# Patient Record
Sex: Male | Born: 1950 | Race: White | Hispanic: No | Smoking: Never smoker
Health system: Southern US, Community
[De-identification: ages and names within clinical notes are randomized; demographics above are authoritative.]

## PROBLEM LIST (undated history)

## (undated) DIAGNOSIS — I10 Essential (primary) hypertension: Secondary | ICD-10-CM

## (undated) DIAGNOSIS — E7800 Pure hypercholesterolemia, unspecified: Secondary | ICD-10-CM

## (undated) HISTORY — PX: HERNIA REPAIR: SHX51

## (undated) HISTORY — DX: Pure hypercholesterolemia, unspecified: E78.00

## (undated) HISTORY — DX: Essential (primary) hypertension: I10

## (undated) HISTORY — PX: WRIST FRACTURE SURGERY: SHX121

---

## 1979-03-03 HISTORY — PX: WRIST SURGERY: SHX841

## 2004-07-21 ENCOUNTER — Ambulatory Visit (HOSPITAL_COMMUNITY): Admission: RE | Admit: 2004-07-21 | Discharge: 2004-07-21 | Payer: Self-pay | Admitting: Family Medicine

## 2005-03-20 ENCOUNTER — Ambulatory Visit (HOSPITAL_COMMUNITY): Admission: RE | Admit: 2005-03-20 | Discharge: 2005-03-20 | Payer: Self-pay | Admitting: Family Medicine

## 2005-03-21 ENCOUNTER — Ambulatory Visit (HOSPITAL_COMMUNITY): Admission: RE | Admit: 2005-03-21 | Discharge: 2005-03-21 | Payer: Self-pay | Admitting: Family Medicine

## 2008-04-28 ENCOUNTER — Ambulatory Visit (HOSPITAL_COMMUNITY): Admission: RE | Admit: 2008-04-28 | Discharge: 2008-04-28 | Payer: Self-pay | Admitting: Internal Medicine

## 2008-04-28 ENCOUNTER — Ambulatory Visit: Payer: Self-pay | Admitting: Internal Medicine

## 2009-08-24 ENCOUNTER — Ambulatory Visit (HOSPITAL_COMMUNITY): Admission: RE | Admit: 2009-08-24 | Discharge: 2009-08-24 | Payer: Self-pay | Admitting: Family Medicine

## 2009-09-21 ENCOUNTER — Encounter: Payer: Self-pay | Admitting: Orthopedic Surgery

## 2009-11-07 ENCOUNTER — Encounter: Payer: Self-pay | Admitting: Orthopedic Surgery

## 2009-11-08 ENCOUNTER — Ambulatory Visit: Payer: Self-pay | Admitting: Orthopedic Surgery

## 2009-11-08 DIAGNOSIS — G5600 Carpal tunnel syndrome, unspecified upper limb: Secondary | ICD-10-CM

## 2009-11-08 DIAGNOSIS — M19039 Primary osteoarthritis, unspecified wrist: Secondary | ICD-10-CM | POA: Insufficient documentation

## 2009-11-08 DIAGNOSIS — S62009A Unspecified fracture of navicular [scaphoid] bone of unspecified wrist, initial encounter for closed fracture: Secondary | ICD-10-CM
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
## 2010-08-01 NOTE — Medication Information (Signed)
Summary: RX Folder  RX Folder   Imported By: Cammie Sickle 11/09/2009 11:31:09  _____________________________________________________________________  External Attachment:    Type:   Image     Comment:   External Document

## 2010-08-01 NOTE — Letter (Signed)
Summary: History form  History form   Imported By: Jacklynn Ganong 11/10/2009 08:07:03  _____________________________________________________________________  External Attachment:    Type:   Image     Comment:   External Document

## 2010-11-14 NOTE — Op Note (Signed)
NAMEMONNIE, ANSPACH          ACCOUNT NO.:  [id number]   MEDICAL RECORD NO.:  [id number]          PATIENT TYPE:  AMB   LOCATION:  DAY                           FACILITY:  [facility]   PHYSICIAN:  R. Roetta Sessions, M.D. DATE OF BIRTH:  10-04-1950   DATE OF PROCEDURE:  04/28/2008  DATE OF DISCHARGE:                               OPERATIVE REPORT   INDICATIONS FOR PROCEDURE:  A 60 year old gentleman with intermittent  rectal bleeding when he wipes, referred at the courtesy of Dr. Janna Arch  for colorectal cancer screening.  He has never had his lower GI tract  evaluated.  He has no family history of polyps or colon cancer.  He is  here for colonoscopy.  Risks, benefits, alternatives, and limitations  have been reviewed, questions answered, and he is agreeable.  Please see  the documentation in the medical record.   PROCEDURE NOTE:  O2 saturation, blood pressure, pulse, and respirations  were monitored throughout the entire procedure.   CONSCIOUS SEDATION:  Versed 4 mg IV, Demerol 100 mg IV in divided doses.   INSTRUMENT:  Pentax video chip system.   FINDINGS:  Digital rectal examination revealed no abnormalities.  Endoscopic Findings:  Prep was adequate.  Colon:  Colonic mucosa was  surveyed from the rectosigmoid junction through the left transverse,  right colon, the appendiceal orifice, ileocecal valve, and cecum.  These  structures were well seen and photographed for the record.  From this  level, scope was withdrawn.  All previous mentioned mucosal surfaces  were again seen.  The colonic mucosa appeared entirely normal.  Scope  was pulled down the rectum.  Thorough examination of the rectal mucosa  including retroflexed view of the anal verge demonstrated internal  hemorrhoids, anal papilla only.  The patient tolerated the procedure  well and was reactive in endoscopy.   IMPRESSION:  Internal hemorrhoids, anal papilla, otherwise normal rectum  and colon.   RECOMMENDATIONS:   Hemorrhoid literature provided to Mr. Whiteside.  He is  a long distance Naval architect.  Consider donut __________ cushion to his  seat.  Anusol-HC Suppositories one per rectum at bedtime, daily fiber  supplement, Metamucil or Citrucel.  Repeat screening colonoscopy in 10  years.      Jonathon Bellows, M.D.  Electronically Signed     RMR/MEDQ  D:  04/28/2008  T:  04/28/2008  Job:  [id number]   cc:   Melvyn Novas, MD  Fax: [phone]

## 2011-01-30 ENCOUNTER — Ambulatory Visit (INDEPENDENT_AMBULATORY_CARE_PROVIDER_SITE_OTHER): Payer: BC Managed Care – PPO | Admitting: Orthopedic Surgery

## 2011-01-30 ENCOUNTER — Encounter: Payer: Self-pay | Admitting: Orthopedic Surgery

## 2011-01-30 VITALS — Resp 16 | Ht 68.0 in | Wt 194.0 lb

## 2011-01-30 DIAGNOSIS — M19039 Primary osteoarthritis, unspecified wrist: Secondary | ICD-10-CM

## 2011-01-30 NOTE — Patient Instructions (Addendum)
Referral to Dr. Amanda Pea for wrist
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
## 2011-01-31 ENCOUNTER — Encounter: Payer: Self-pay | Admitting: Orthopedic Surgery

## 2011-01-31 ENCOUNTER — Telehealth: Payer: Self-pay | Admitting: Radiology

## 2011-01-31 DIAGNOSIS — M19039 Primary osteoarthritis, unspecified wrist: Secondary | ICD-10-CM | POA: Insufficient documentation

## 2011-01-31 NOTE — Telephone Encounter (Signed)
I faxed a referral for this patient to [facility] to see Dr. Amanda Pea for arthritis of his wrist.

## 2016-01-23 ENCOUNTER — Other Ambulatory Visit (HOSPITAL_COMMUNITY): Payer: Self-pay | Admitting: Family Medicine

## 2016-01-23 ENCOUNTER — Ambulatory Visit (HOSPITAL_COMMUNITY)
Admission: RE | Admit: 2016-01-23 | Discharge: 2016-01-23 | Disposition: A | Discharge status: Home / Self Care | Payer: BLUE CROSS/BLUE SHIELD | Source: Ambulatory Visit | Attending: Family Medicine | Admitting: Family Medicine

## 2016-01-23 DIAGNOSIS — R918 Other nonspecific abnormal finding of lung field: Secondary | ICD-10-CM | POA: Insufficient documentation

## 2016-01-23 DIAGNOSIS — R062 Wheezing: Secondary | ICD-10-CM | POA: Diagnosis present

## 2016-08-28 DIAGNOSIS — I110 Hypertensive heart disease with heart failure: Secondary | ICD-10-CM | POA: Diagnosis not present

## 2016-08-28 DIAGNOSIS — R5383 Other fatigue: Secondary | ICD-10-CM | POA: Diagnosis not present

## 2016-12-24 ENCOUNTER — Other Ambulatory Visit (HOSPITAL_COMMUNITY): Payer: Self-pay | Admitting: Family Medicine

## 2016-12-24 ENCOUNTER — Ambulatory Visit (HOSPITAL_COMMUNITY)
Admission: RE | Admit: 2016-12-24 | Discharge: 2016-12-24 | Disposition: A | Discharge status: Home / Self Care | Payer: Medicare Other | Source: Ambulatory Visit | Attending: Family Medicine | Admitting: Family Medicine

## 2016-12-24 DIAGNOSIS — R053 Chronic cough: Secondary | ICD-10-CM

## 2016-12-24 DIAGNOSIS — R05 Cough: Secondary | ICD-10-CM

## 2016-12-24 DIAGNOSIS — R918 Other nonspecific abnormal finding of lung field: Secondary | ICD-10-CM | POA: Diagnosis not present

## 2016-12-24 DIAGNOSIS — M2550 Pain in unspecified joint: Secondary | ICD-10-CM | POA: Diagnosis not present

## 2016-12-24 DIAGNOSIS — E784 Other hyperlipidemia: Secondary | ICD-10-CM | POA: Diagnosis not present

## 2016-12-24 DIAGNOSIS — H04123 Dry eye syndrome of bilateral lacrimal glands: Secondary | ICD-10-CM | POA: Diagnosis not present

## 2016-12-24 DIAGNOSIS — K219 Gastro-esophageal reflux disease without esophagitis: Secondary | ICD-10-CM | POA: Diagnosis not present

## 2016-12-24 DIAGNOSIS — J45909 Unspecified asthma, uncomplicated: Secondary | ICD-10-CM | POA: Diagnosis not present

## 2016-12-25 ENCOUNTER — Other Ambulatory Visit (HOSPITAL_COMMUNITY): Payer: Self-pay | Admitting: Family Medicine

## 2016-12-25 DIAGNOSIS — I110 Hypertensive heart disease with heart failure: Secondary | ICD-10-CM | POA: Diagnosis not present

## 2016-12-25 DIAGNOSIS — R918 Other nonspecific abnormal finding of lung field: Secondary | ICD-10-CM

## 2016-12-25 DIAGNOSIS — E784 Other hyperlipidemia: Secondary | ICD-10-CM | POA: Diagnosis not present

## 2016-12-25 DIAGNOSIS — R5383 Other fatigue: Secondary | ICD-10-CM | POA: Diagnosis not present

## 2016-12-25 DIAGNOSIS — E559 Vitamin D deficiency, unspecified: Secondary | ICD-10-CM | POA: Diagnosis not present

## 2016-12-25 DIAGNOSIS — D510 Vitamin B12 deficiency anemia due to intrinsic factor deficiency: Secondary | ICD-10-CM | POA: Diagnosis not present

## 2016-12-25 DIAGNOSIS — Z125 Encounter for screening for malignant neoplasm of prostate: Secondary | ICD-10-CM | POA: Diagnosis not present

## 2016-12-27 ENCOUNTER — Ambulatory Visit (HOSPITAL_COMMUNITY)
Admission: RE | Admit: 2016-12-27 | Discharge: 2016-12-27 | Disposition: A | Discharge status: Home / Self Care | Payer: Medicare Other | Source: Ambulatory Visit | Attending: Family Medicine | Admitting: Family Medicine

## 2016-12-27 ENCOUNTER — Encounter (HOSPITAL_COMMUNITY): Payer: Self-pay

## 2016-12-27 DIAGNOSIS — I700 Atherosclerosis of aorta: Secondary | ICD-10-CM | POA: Diagnosis not present

## 2016-12-27 DIAGNOSIS — R918 Other nonspecific abnormal finding of lung field: Secondary | ICD-10-CM | POA: Diagnosis not present

## 2016-12-27 DIAGNOSIS — I2510 Atherosclerotic heart disease of native coronary artery without angina pectoris: Secondary | ICD-10-CM | POA: Insufficient documentation

## 2016-12-27 DIAGNOSIS — R911 Solitary pulmonary nodule: Secondary | ICD-10-CM | POA: Insufficient documentation

## 2016-12-27 LAB — POCT I-STAT CREATININE: CREATININE: 1.2 mg/dL (ref 0.61–1.24)

## 2016-12-27 MED ORDER — IOPAMIDOL (ISOVUE-300) INJECTION 61%
100.0000 mL | Freq: Once | INTRAVENOUS | Status: DC | PRN
Start: 2016-12-27 — End: 2016-12-27

## 2016-12-27 MED ORDER — IOPAMIDOL (ISOVUE-300) INJECTION 61%
75.0000 mL | Freq: Once | INTRAVENOUS | Status: AC | PRN
  Administered 2016-12-27: 75 mL via INTRAVENOUS

## 2017-01-09 ENCOUNTER — Other Ambulatory Visit (HOSPITAL_COMMUNITY): Payer: Self-pay | Admitting: Family Medicine

## 2017-01-09 DIAGNOSIS — R911 Solitary pulmonary nodule: Secondary | ICD-10-CM

## 2017-01-09 DIAGNOSIS — E784 Other hyperlipidemia: Secondary | ICD-10-CM | POA: Diagnosis not present

## 2017-01-09 DIAGNOSIS — J45909 Unspecified asthma, uncomplicated: Secondary | ICD-10-CM | POA: Diagnosis not present

## 2017-01-09 DIAGNOSIS — K219 Gastro-esophageal reflux disease without esophagitis: Secondary | ICD-10-CM | POA: Diagnosis not present

## 2017-01-09 DIAGNOSIS — R05 Cough: Secondary | ICD-10-CM | POA: Diagnosis not present

## 2017-01-22 ENCOUNTER — Ambulatory Visit (HOSPITAL_COMMUNITY)
Admission: RE | Admit: 2017-01-22 | Discharge: 2017-01-22 | Disposition: A | Discharge status: Home / Self Care | Payer: Medicare Other | Source: Ambulatory Visit | Attending: Family Medicine | Admitting: Family Medicine

## 2017-01-22 DIAGNOSIS — R911 Solitary pulmonary nodule: Secondary | ICD-10-CM | POA: Insufficient documentation

## 2017-01-22 DIAGNOSIS — I714 Abdominal aortic aneurysm, without rupture: Secondary | ICD-10-CM | POA: Diagnosis not present

## 2017-01-22 DIAGNOSIS — I723 Aneurysm of iliac artery: Secondary | ICD-10-CM | POA: Insufficient documentation

## 2017-01-22 LAB — GLUCOSE, CAPILLARY: Glucose-Capillary: 120 mg/dL — ABNORMAL HIGH (ref 65–99)

## 2017-01-22 MED ORDER — FLUDEOXYGLUCOSE F - 18 (FDG) INJECTION
11.5000 | Freq: Once | INTRAVENOUS | Status: AC | PRN
  Administered 2017-01-22: 11.5 via INTRAVENOUS

## 2017-01-24 DIAGNOSIS — J45909 Unspecified asthma, uncomplicated: Secondary | ICD-10-CM | POA: Diagnosis not present

## 2017-01-24 DIAGNOSIS — E784 Other hyperlipidemia: Secondary | ICD-10-CM | POA: Diagnosis not present

## 2017-01-24 DIAGNOSIS — I110 Hypertensive heart disease with heart failure: Secondary | ICD-10-CM | POA: Diagnosis not present

## 2017-01-24 DIAGNOSIS — R05 Cough: Secondary | ICD-10-CM | POA: Diagnosis not present

## 2017-04-05 DIAGNOSIS — R911 Solitary pulmonary nodule: Secondary | ICD-10-CM | POA: Diagnosis not present

## 2017-04-05 DIAGNOSIS — R0982 Postnasal drip: Secondary | ICD-10-CM | POA: Diagnosis not present

## 2017-04-05 DIAGNOSIS — E782 Mixed hyperlipidemia: Secondary | ICD-10-CM | POA: Diagnosis not present

## 2017-04-05 DIAGNOSIS — R05 Cough: Secondary | ICD-10-CM | POA: Diagnosis not present

## 2017-04-05 DIAGNOSIS — Z6830 Body mass index (BMI) 30.0-30.9, adult: Secondary | ICD-10-CM | POA: Diagnosis not present

## 2017-04-05 DIAGNOSIS — I714 Abdominal aortic aneurysm, without rupture: Secondary | ICD-10-CM | POA: Diagnosis not present

## 2017-04-05 DIAGNOSIS — I10 Essential (primary) hypertension: Secondary | ICD-10-CM | POA: Diagnosis not present

## 2017-07-17 DIAGNOSIS — I723 Aneurysm of iliac artery: Secondary | ICD-10-CM | POA: Diagnosis not present

## 2017-07-17 DIAGNOSIS — I714 Abdominal aortic aneurysm, without rupture: Secondary | ICD-10-CM | POA: Diagnosis not present

## 2017-07-25 DIAGNOSIS — Z575 Occupational exposure to toxic agents in other industries: Secondary | ICD-10-CM | POA: Diagnosis not present

## 2017-07-25 DIAGNOSIS — Z01810 Encounter for preprocedural cardiovascular examination: Secondary | ICD-10-CM | POA: Diagnosis not present

## 2017-07-25 DIAGNOSIS — R05 Cough: Secondary | ICD-10-CM | POA: Diagnosis not present

## 2017-07-25 DIAGNOSIS — Z5739 Occupational exposure to other air contaminants: Secondary | ICD-10-CM | POA: Diagnosis not present

## 2017-07-25 DIAGNOSIS — I714 Abdominal aortic aneurysm, without rupture: Secondary | ICD-10-CM | POA: Diagnosis not present

## 2017-07-25 DIAGNOSIS — R911 Solitary pulmonary nodule: Secondary | ICD-10-CM | POA: Diagnosis not present

## 2017-08-05 DIAGNOSIS — R911 Solitary pulmonary nodule: Secondary | ICD-10-CM | POA: Diagnosis not present

## 2017-08-06 DIAGNOSIS — I10 Essential (primary) hypertension: Secondary | ICD-10-CM | POA: Diagnosis present

## 2017-08-06 DIAGNOSIS — R911 Solitary pulmonary nodule: Secondary | ICD-10-CM | POA: Diagnosis present

## 2017-08-06 DIAGNOSIS — I959 Hypotension, unspecified: Secondary | ICD-10-CM | POA: Diagnosis not present

## 2017-08-06 DIAGNOSIS — R918 Other nonspecific abnormal finding of lung field: Secondary | ICD-10-CM | POA: Diagnosis not present

## 2017-08-06 DIAGNOSIS — I723 Aneurysm of iliac artery: Secondary | ICD-10-CM | POA: Diagnosis present

## 2017-08-06 DIAGNOSIS — J952 Acute pulmonary insufficiency following nonthoracic surgery: Secondary | ICD-10-CM | POA: Diagnosis not present

## 2017-08-06 DIAGNOSIS — E876 Hypokalemia: Secondary | ICD-10-CM | POA: Diagnosis not present

## 2017-08-06 DIAGNOSIS — D62 Acute posthemorrhagic anemia: Secondary | ICD-10-CM | POA: Diagnosis not present

## 2017-08-06 DIAGNOSIS — K429 Umbilical hernia without obstruction or gangrene: Secondary | ICD-10-CM | POA: Diagnosis present

## 2017-08-06 DIAGNOSIS — I714 Abdominal aortic aneurysm, without rupture: Secondary | ICD-10-CM | POA: Diagnosis present

## 2017-08-06 DIAGNOSIS — Z9981 Dependence on supplemental oxygen: Secondary | ICD-10-CM | POA: Diagnosis not present

## 2017-08-06 DIAGNOSIS — E785 Hyperlipidemia, unspecified: Secondary | ICD-10-CM | POA: Diagnosis present

## 2017-08-11 MED ORDER — HYDROCODONE-ACETAMINOPHEN 5-325 MG PO TABS
1.00 | ORAL_TABLET | ORAL | Status: DC
Start: ? — End: 2017-08-11

## 2017-08-11 MED ORDER — POLYETHYL GLYCOL-PROPYL GLYCOL 0.4-0.3 % OP SOLN
1.00 | OPHTHALMIC | Status: DC
Start: ? — End: 2017-08-11

## 2017-08-11 MED ORDER — SIMETHICONE 80 MG PO CHEW
80.00 | CHEWABLE_TABLET | ORAL | Status: DC
Start: ? — End: 2017-08-11

## 2017-08-11 MED ORDER — GENERIC EXTERNAL MEDICATION
1.00 | Status: DC
Start: ? — End: 2017-08-11

## 2017-08-11 MED ORDER — PANTOPRAZOLE SODIUM 40 MG PO TBEC
40.00 | DELAYED_RELEASE_TABLET | ORAL | Status: DC
Start: 2017-08-12 — End: 2017-08-11

## 2017-08-11 MED ORDER — GENERIC EXTERNAL MEDICATION
2.00 | Status: DC
Start: ? — End: 2017-08-11

## 2017-08-11 MED ORDER — ACETAMINOPHEN 325 MG PO TABS
325.00 | ORAL_TABLET | ORAL | Status: DC
Start: 2017-08-11 — End: 2017-08-11

## 2017-08-11 MED ORDER — BENZOCAINE-MENTHOL 6-10 MG MT LOZG
1.00 | LOZENGE | OROMUCOSAL | Status: DC
Start: ? — End: 2017-08-11

## 2017-08-11 MED ORDER — ONDANSETRON 4 MG PO TBDP
4.00 | ORAL_TABLET | ORAL | Status: DC
Start: ? — End: 2017-08-11

## 2017-08-11 MED ORDER — DOCUSATE SODIUM 100 MG PO CAPS
100.00 | ORAL_CAPSULE | ORAL | Status: DC
Start: 2017-08-12 — End: 2017-08-11

## 2017-08-11 MED ORDER — POLYETHYLENE GLYCOL 3350 17 G PO PACK
17.00 g | PACK | ORAL | Status: DC
Start: 2017-08-12 — End: 2017-08-11

## 2017-08-11 MED ORDER — HEPARIN SODIUM (PORCINE) 5000 UNIT/ML IJ SOLN
5000.00 | INTRAMUSCULAR | Status: DC
Start: 2017-08-11 — End: 2017-08-11

## 2017-08-11 MED ORDER — ROSUVASTATIN CALCIUM 20 MG PO TABS
20.00 | ORAL_TABLET | ORAL | Status: DC
Start: 2017-08-12 — End: 2017-08-11

## 2017-08-29 DIAGNOSIS — R05 Cough: Secondary | ICD-10-CM | POA: Diagnosis not present

## 2017-09-12 DIAGNOSIS — I10 Essential (primary) hypertension: Secondary | ICD-10-CM | POA: Diagnosis not present

## 2017-09-12 DIAGNOSIS — Z6828 Body mass index (BMI) 28.0-28.9, adult: Secondary | ICD-10-CM | POA: Diagnosis not present

## 2017-09-12 DIAGNOSIS — R911 Solitary pulmonary nodule: Secondary | ICD-10-CM | POA: Diagnosis not present

## 2017-09-12 DIAGNOSIS — R05 Cough: Secondary | ICD-10-CM | POA: Diagnosis not present

## 2017-09-12 DIAGNOSIS — I714 Abdominal aortic aneurysm, without rupture: Secondary | ICD-10-CM | POA: Diagnosis not present

## 2017-09-12 DIAGNOSIS — Z1389 Encounter for screening for other disorder: Secondary | ICD-10-CM | POA: Diagnosis not present

## 2017-09-25 DIAGNOSIS — Z95828 Presence of other vascular implants and grafts: Secondary | ICD-10-CM | POA: Diagnosis not present

## 2017-09-25 DIAGNOSIS — I714 Abdominal aortic aneurysm, without rupture: Secondary | ICD-10-CM | POA: Diagnosis not present

## 2017-11-18 DIAGNOSIS — H5203 Hypermetropia, bilateral: Secondary | ICD-10-CM | POA: Diagnosis not present

## 2017-11-18 DIAGNOSIS — H52222 Regular astigmatism, left eye: Secondary | ICD-10-CM | POA: Diagnosis not present

## 2017-11-18 DIAGNOSIS — H04123 Dry eye syndrome of bilateral lacrimal glands: Secondary | ICD-10-CM | POA: Diagnosis not present

## 2017-11-18 DIAGNOSIS — H16223 Keratoconjunctivitis sicca, not specified as Sjogren's, bilateral: Secondary | ICD-10-CM | POA: Diagnosis not present

## 2018-02-13 DIAGNOSIS — Z23 Encounter for immunization: Secondary | ICD-10-CM | POA: Diagnosis not present

## 2018-02-13 DIAGNOSIS — M129 Arthropathy, unspecified: Secondary | ICD-10-CM | POA: Diagnosis not present

## 2018-02-13 DIAGNOSIS — I714 Abdominal aortic aneurysm, without rupture: Secondary | ICD-10-CM | POA: Diagnosis not present

## 2018-02-13 DIAGNOSIS — I10 Essential (primary) hypertension: Secondary | ICD-10-CM | POA: Diagnosis not present

## 2018-02-13 DIAGNOSIS — E782 Mixed hyperlipidemia: Secondary | ICD-10-CM | POA: Diagnosis not present

## 2018-02-13 DIAGNOSIS — Z6829 Body mass index (BMI) 29.0-29.9, adult: Secondary | ICD-10-CM | POA: Diagnosis not present

## 2018-02-13 DIAGNOSIS — M791 Myalgia, unspecified site: Secondary | ICD-10-CM | POA: Diagnosis not present

## 2018-02-13 DIAGNOSIS — R911 Solitary pulmonary nodule: Secondary | ICD-10-CM | POA: Diagnosis not present

## 2018-02-13 DIAGNOSIS — E785 Hyperlipidemia, unspecified: Secondary | ICD-10-CM | POA: Diagnosis not present

## 2018-02-13 DIAGNOSIS — Z0001 Encounter for general adult medical examination with abnormal findings: Secondary | ICD-10-CM | POA: Diagnosis not present

## 2018-03-18 DIAGNOSIS — Z1211 Encounter for screening for malignant neoplasm of colon: Secondary | ICD-10-CM | POA: Diagnosis not present

## 2018-03-18 DIAGNOSIS — Z1212 Encounter for screening for malignant neoplasm of rectum: Secondary | ICD-10-CM | POA: Diagnosis not present

## 2018-04-10 ENCOUNTER — Encounter: Payer: Self-pay | Admitting: Internal Medicine

## 2018-06-30 DIAGNOSIS — Z6830 Body mass index (BMI) 30.0-30.9, adult: Secondary | ICD-10-CM | POA: Diagnosis not present

## 2018-06-30 DIAGNOSIS — J209 Acute bronchitis, unspecified: Secondary | ICD-10-CM | POA: Diagnosis not present

## 2018-07-22 IMAGING — CT CT CHEST W/ CM
2 of 3 series · 15 of 36 positions shown, 18 images · IV contrast (iopamidol)
Comparison: Chest x-ray of 12/24/2016 and 01/22/2006

CLINICAL DATA: Questionable nodule on prior chest x-rays. Cough for
several months

EXAM:
CT CHEST WITH CONTRAST
TECHNIQUE: Multidetector CT imaging of the chest was performed during
intravenous contrast administration.
CONTRAST:  75mL O0NOZN-YUU IOPAMIDOL (O0NOZN-YUU) INJECTION 61%

[Series 2: axial st · axial · 0.76mm/px · z∈[-494,-202]mm · 12 of 172 slices shown, 15 images]
[im 13/172  mediastinal]
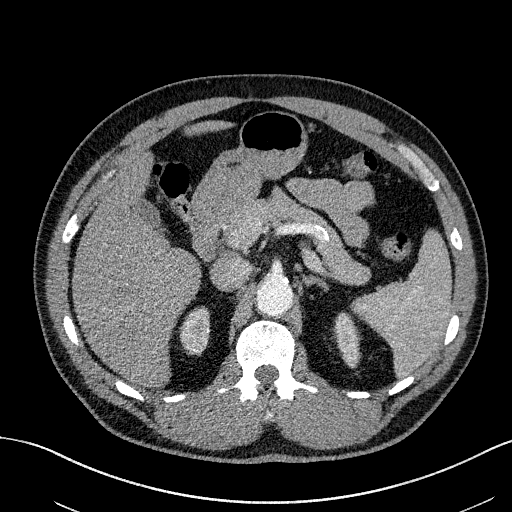
[im 13/172  lung]
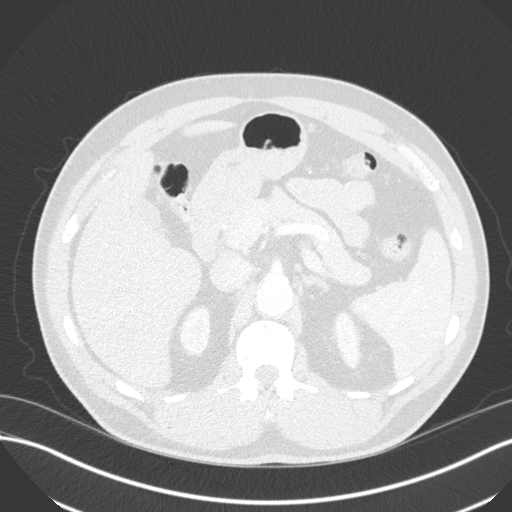
[im 26/172  lung]
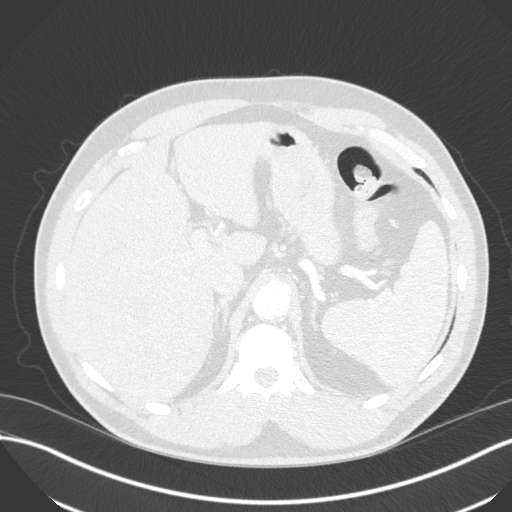
[im 39/172  lung]
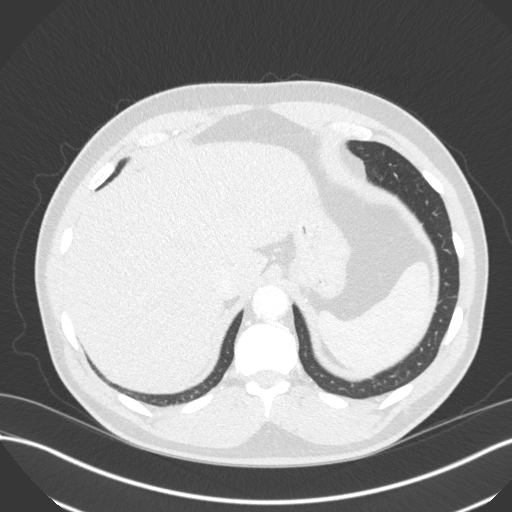
[im 51/172  lung]
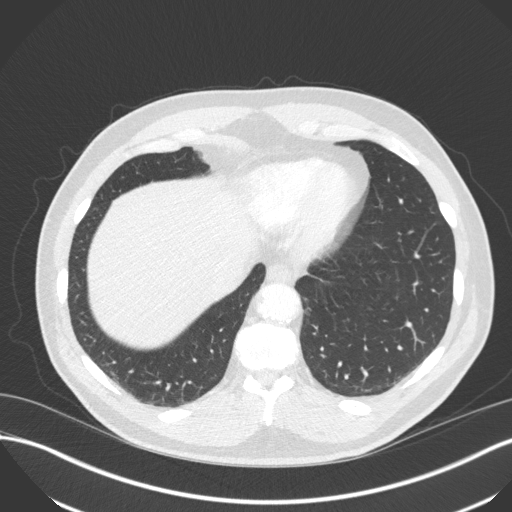
[im 64/172  mediastinal]
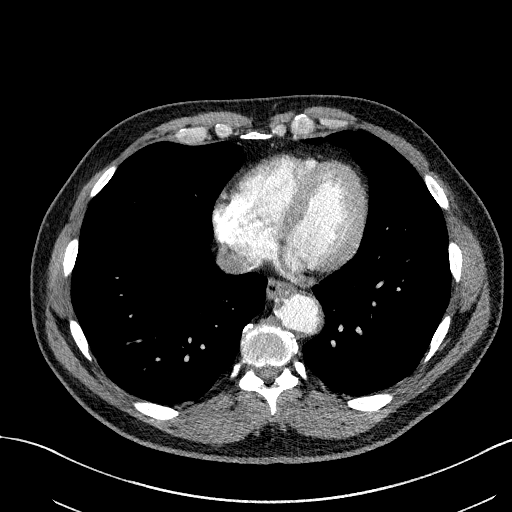
[im 64/172  lung]
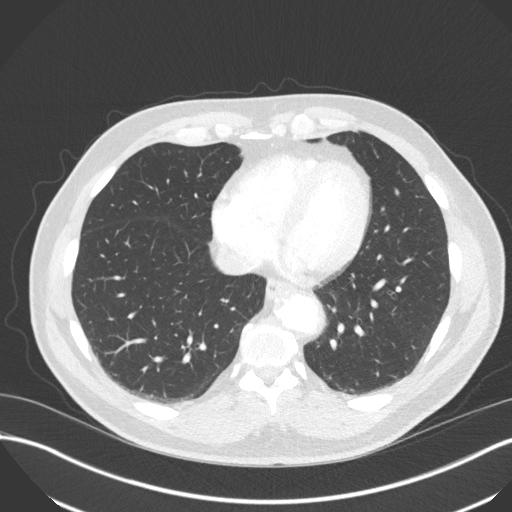
[im 77/172  lung]
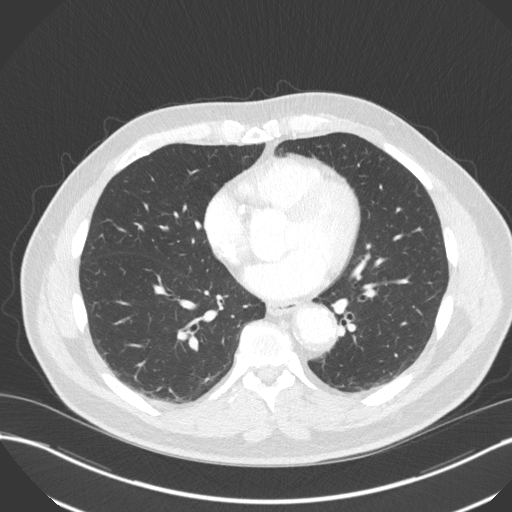
[im 96/172  lung]
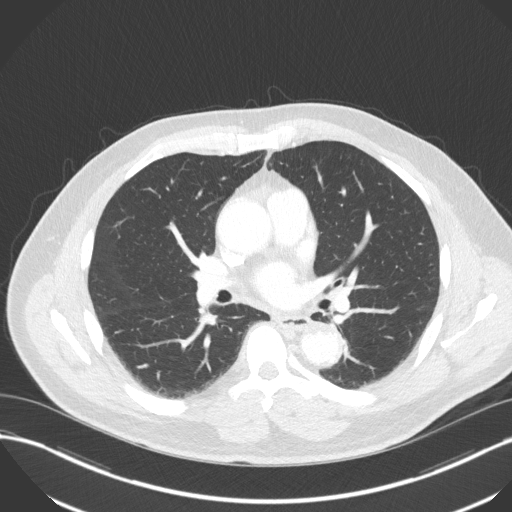
[im 108/172  lung]
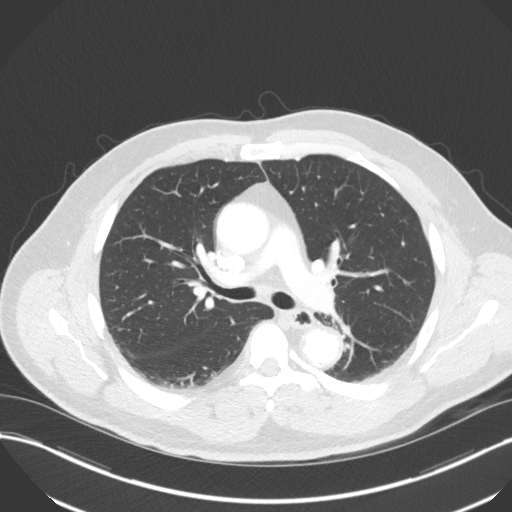
[im 121/172  mediastinal]
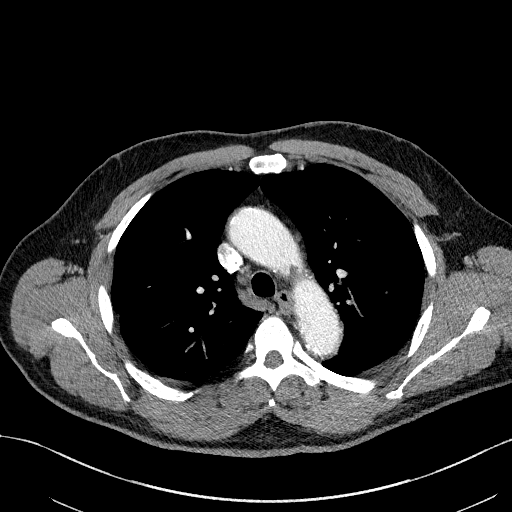
[im 121/172  lung]
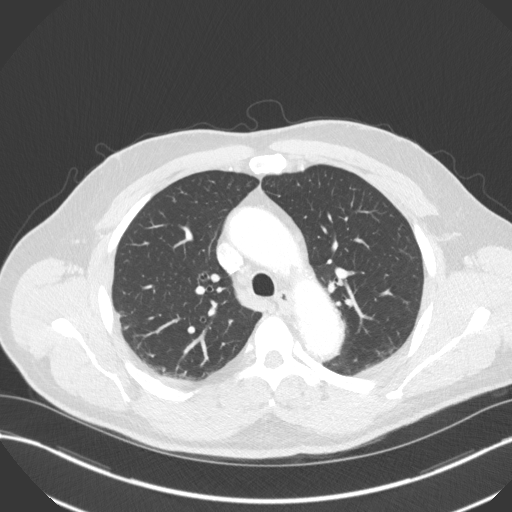
[im 134/172  lung]
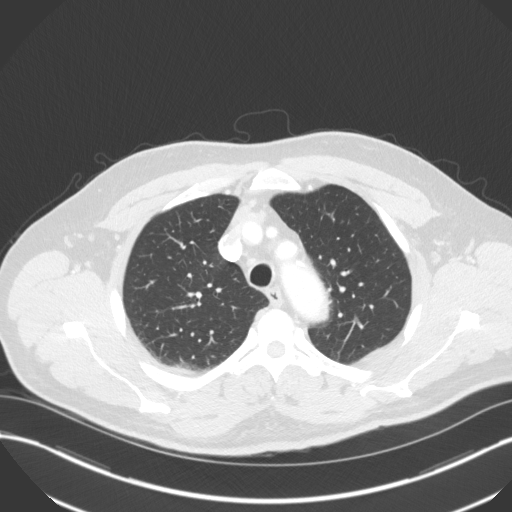
[im 146/172  lung]
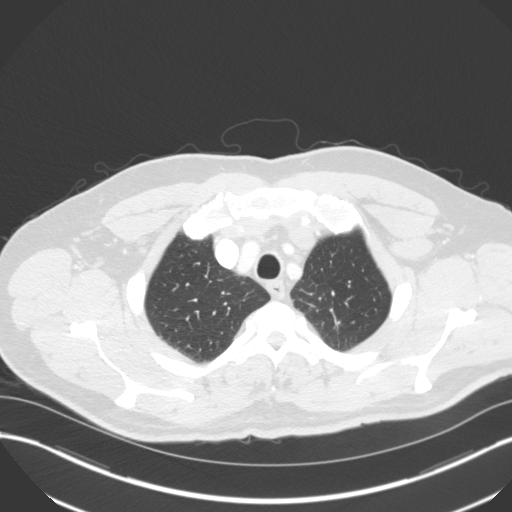
[im 159/172  lung]
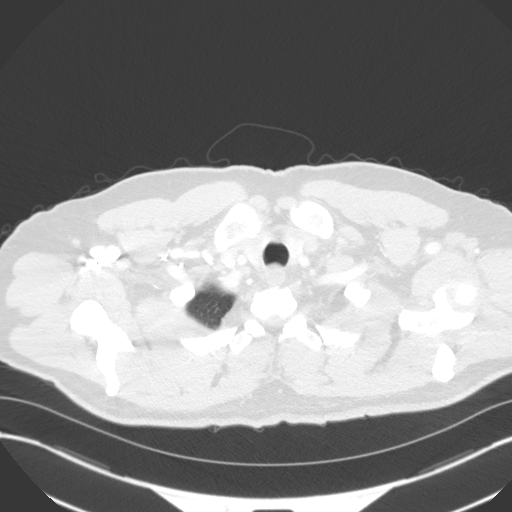

[Series 5: coronal · coronal · 0.69mm/px · 3 of 145 slices shown]
[im 29/145  lung]
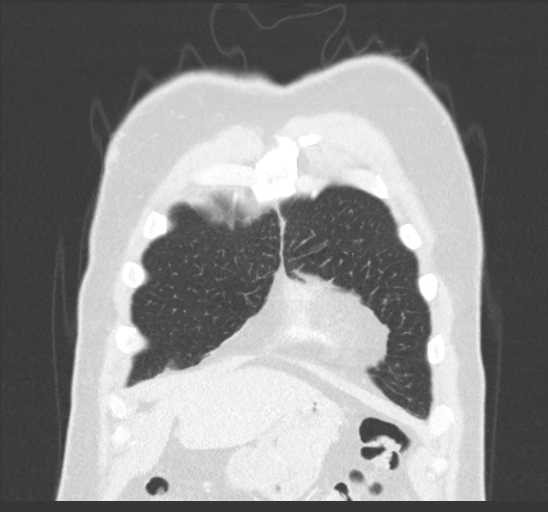
[im 58/145  lung]
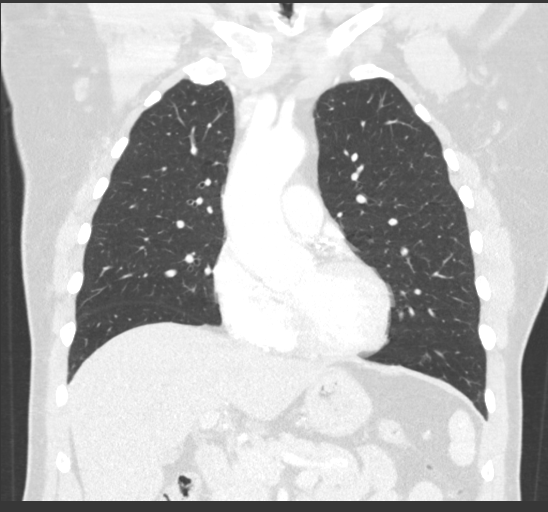
[im 87/145  lung]
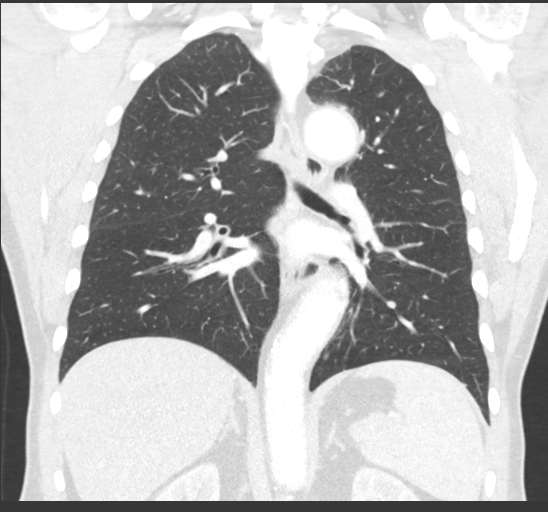

[15 of 36 positions shown; findings below may reference images not displayed]

FINDINGS: Cardiovascular: Significantly age advanced thoracic aortic
atherosclerosis present with plaque formation throughout the
descending thoracic aorta. Diffuse coronary artery calcifications
also are noted. The heart is within normal limits in size. The mid
ascending thoracic aorta measures 40 mm in diameter. Recommend
annual imaging followup by CTA or MRA. This recommendation follows
0696 ACCF/AHA/AATS/ACR/ASA/SCA/MESSI/ALLADEE/TATYANNA/ARIEL DAMIAN Guidelines for the
Diagnosis and Management of Patients with Thoracic Aortic Disease.
Circulation. 0696; 121: [id number]

Mediastinum/Nodes: There are slightly prominent mediastinal and
hilar lymph nodes present measuring between 8 in 10 mm in short axis
diameter. The thyroid gland is unremarkable.

Lungs/Pleura: On lung window images, there is an irregularly
marginated nodule within the periphery of the right upper lobe
posteriorly measuring 13 mm. A primary lung malignancy is a definite
consideration and PET CT imaging is recommended. No other lung
nodule is seen. No parenchymal infiltrate is noted and there is no
evidence of pleural effusion. The central airway is patent.

Upper Abdomen: No definite hepatic abnormality is noted. The
gallbladder is contracted. The pancreas is unremarkable. A few small
lymph nodes are present near the celiac axis.

Musculoskeletal: The thoracic vertebrae are in normal alignment with
no acute abnormality. No lytic or blastic bony lesion is seen.
IMPRESSION: 1. 13 mm irregularly marginated nodule in the right upper lobe
worrisome for primary lung carcinoma. Recommend PET-CT to assess
further.
2. The mid ascending thoracic aorta measures 40 mm in diameter.
Recommend annual imaging followup by CTA or MRA. This recommendation
follows 0696 ACCF/AHA/AATS/ACR/ASA/SCA/MESSI/ALLADEE/TATYANNA/ARIEL DAMIAN Guidelines
for the Diagnosis and Management of Patients with Thoracic Aortic
Disease. Circulation. 0696; 121: [id number]
3. Significantly age advanced thoracic aortic atherosclerosis with
diffuse coronary artery calcifications as well.
4. Borderline mediastinal and hilar lymph nodes.

## 2018-08-06 DIAGNOSIS — E782 Mixed hyperlipidemia: Secondary | ICD-10-CM | POA: Diagnosis not present

## 2018-08-06 DIAGNOSIS — E785 Hyperlipidemia, unspecified: Secondary | ICD-10-CM | POA: Diagnosis not present

## 2018-08-06 DIAGNOSIS — I10 Essential (primary) hypertension: Secondary | ICD-10-CM | POA: Diagnosis not present

## 2019-02-16 ENCOUNTER — Other Ambulatory Visit: Payer: Self-pay | Admitting: Physician Assistant

## 2019-02-16 ENCOUNTER — Other Ambulatory Visit (HOSPITAL_COMMUNITY): Payer: Self-pay | Admitting: Physician Assistant

## 2019-02-16 DIAGNOSIS — R911 Solitary pulmonary nodule: Secondary | ICD-10-CM | POA: Diagnosis not present

## 2019-02-16 DIAGNOSIS — K219 Gastro-esophageal reflux disease without esophagitis: Secondary | ICD-10-CM | POA: Diagnosis not present

## 2019-02-16 DIAGNOSIS — Z0000 Encounter for general adult medical examination without abnormal findings: Secondary | ICD-10-CM | POA: Diagnosis not present

## 2019-02-16 DIAGNOSIS — I10 Essential (primary) hypertension: Secondary | ICD-10-CM | POA: Diagnosis not present

## 2019-02-16 DIAGNOSIS — Z6830 Body mass index (BMI) 30.0-30.9, adult: Secondary | ICD-10-CM | POA: Diagnosis not present

## 2019-02-16 DIAGNOSIS — E782 Mixed hyperlipidemia: Secondary | ICD-10-CM | POA: Diagnosis not present

## 2019-02-27 ENCOUNTER — Ambulatory Visit (HOSPITAL_COMMUNITY): Payer: Medicare Other

## 2019-03-05 ENCOUNTER — Ambulatory Visit (HOSPITAL_COMMUNITY): Payer: Medicare Other

## 2019-03-13 ENCOUNTER — Ambulatory Visit (HOSPITAL_COMMUNITY)
Admission: RE | Admit: 2019-03-13 | Discharge: 2019-03-13 | Disposition: A | Discharge status: Home / Self Care | Payer: Medicare Other | Source: Ambulatory Visit | Attending: Physician Assistant | Admitting: Physician Assistant

## 2019-03-13 ENCOUNTER — Other Ambulatory Visit: Payer: Self-pay

## 2019-03-13 DIAGNOSIS — R911 Solitary pulmonary nodule: Secondary | ICD-10-CM | POA: Diagnosis not present

## 2019-03-13 DIAGNOSIS — R918 Other nonspecific abnormal finding of lung field: Secondary | ICD-10-CM | POA: Diagnosis not present

## 2019-05-27 ENCOUNTER — Other Ambulatory Visit: Payer: Self-pay

## 2019-09-24 ENCOUNTER — Ambulatory Visit: Payer: Medicare Other | Attending: Internal Medicine

## 2019-09-24 DIAGNOSIS — Z23 Encounter for immunization: Secondary | ICD-10-CM

## 2019-09-24 NOTE — Progress Notes (Signed)
   Covid-19 Vaccination Clinic  Name:  EVONTE PRESTAGE    MRN: [id number] DOB: Jul 11, 1950  09/24/2019  Mr. Opdahl was observed post Covid-19 immunization for 15 minutes without incident. He was provided with Vaccine Information Sheet and instruction to access the V-Safe system.   Mr. Bultman was instructed to call 911 with any severe reactions post vaccine: Marland Kitchen Difficulty breathing  . Swelling of face and throat  . A fast heartbeat  . A bad rash all over body  . Dizziness and weakness   Immunizations Administered    Name Date Dose VIS Date Route   Moderna COVID-19 Vaccine 09/24/2019  9:09 AM 0.5 mL 06/02/2019 Intramuscular   Manufacturer: Moderna   Lot: [id number]   NDC: 65993-570-17

## 2019-10-28 ENCOUNTER — Ambulatory Visit: Payer: Medicare Other

## 2019-11-25 DIAGNOSIS — Z23 Encounter for immunization: Secondary | ICD-10-CM | POA: Diagnosis not present

## 2020-02-22 DIAGNOSIS — Z23 Encounter for immunization: Secondary | ICD-10-CM | POA: Diagnosis not present

## 2020-02-22 DIAGNOSIS — E782 Mixed hyperlipidemia: Secondary | ICD-10-CM | POA: Diagnosis not present

## 2020-02-22 DIAGNOSIS — Z0000 Encounter for general adult medical examination without abnormal findings: Secondary | ICD-10-CM | POA: Diagnosis not present

## 2020-02-22 DIAGNOSIS — I10 Essential (primary) hypertension: Secondary | ICD-10-CM | POA: Diagnosis not present

## 2020-02-22 DIAGNOSIS — Z6830 Body mass index (BMI) 30.0-30.9, adult: Secondary | ICD-10-CM | POA: Diagnosis not present

## 2020-02-22 DIAGNOSIS — K219 Gastro-esophageal reflux disease without esophagitis: Secondary | ICD-10-CM | POA: Diagnosis not present

## 2020-02-22 DIAGNOSIS — N529 Male erectile dysfunction, unspecified: Secondary | ICD-10-CM | POA: Diagnosis not present

## 2020-02-22 DIAGNOSIS — R911 Solitary pulmonary nodule: Secondary | ICD-10-CM | POA: Diagnosis not present

## 2020-07-25 DIAGNOSIS — Z23 Encounter for immunization: Secondary | ICD-10-CM | POA: Diagnosis not present

## 2021-02-01 DIAGNOSIS — R911 Solitary pulmonary nodule: Secondary | ICD-10-CM | POA: Diagnosis not present

## 2021-02-01 DIAGNOSIS — Z0000 Encounter for general adult medical examination without abnormal findings: Secondary | ICD-10-CM | POA: Diagnosis not present

## 2021-02-01 DIAGNOSIS — E782 Mixed hyperlipidemia: Secondary | ICD-10-CM | POA: Diagnosis not present

## 2021-02-01 DIAGNOSIS — N529 Male erectile dysfunction, unspecified: Secondary | ICD-10-CM | POA: Diagnosis not present

## 2021-02-01 DIAGNOSIS — Z6829 Body mass index (BMI) 29.0-29.9, adult: Secondary | ICD-10-CM | POA: Diagnosis not present

## 2021-02-01 DIAGNOSIS — I10 Essential (primary) hypertension: Secondary | ICD-10-CM | POA: Diagnosis not present

## 2021-02-01 DIAGNOSIS — K219 Gastro-esophageal reflux disease without esophagitis: Secondary | ICD-10-CM | POA: Diagnosis not present

## 2021-02-08 DIAGNOSIS — R7309 Other abnormal glucose: Secondary | ICD-10-CM | POA: Diagnosis not present

## 2021-02-08 DIAGNOSIS — Z1329 Encounter for screening for other suspected endocrine disorder: Secondary | ICD-10-CM | POA: Diagnosis not present

## 2021-02-08 DIAGNOSIS — E782 Mixed hyperlipidemia: Secondary | ICD-10-CM | POA: Diagnosis not present

## 2021-02-08 DIAGNOSIS — E7849 Other hyperlipidemia: Secondary | ICD-10-CM | POA: Diagnosis not present

## 2021-02-08 DIAGNOSIS — E785 Hyperlipidemia, unspecified: Secondary | ICD-10-CM | POA: Diagnosis not present

## 2021-02-08 DIAGNOSIS — Z131 Encounter for screening for diabetes mellitus: Secondary | ICD-10-CM | POA: Diagnosis not present

## 2021-02-08 DIAGNOSIS — K219 Gastro-esophageal reflux disease without esophagitis: Secondary | ICD-10-CM | POA: Diagnosis not present

## 2021-02-08 DIAGNOSIS — I10 Essential (primary) hypertension: Secondary | ICD-10-CM | POA: Diagnosis not present

## 2021-02-24 DIAGNOSIS — Z6829 Body mass index (BMI) 29.0-29.9, adult: Secondary | ICD-10-CM | POA: Diagnosis not present

## 2021-02-24 DIAGNOSIS — I10 Essential (primary) hypertension: Secondary | ICD-10-CM | POA: Diagnosis not present

## 2021-02-24 DIAGNOSIS — Z0001 Encounter for general adult medical examination with abnormal findings: Secondary | ICD-10-CM | POA: Diagnosis not present

## 2021-02-24 DIAGNOSIS — E785 Hyperlipidemia, unspecified: Secondary | ICD-10-CM | POA: Diagnosis not present

## 2021-02-24 DIAGNOSIS — F17210 Nicotine dependence, cigarettes, uncomplicated: Secondary | ICD-10-CM | POA: Diagnosis not present

## 2021-03-08 DIAGNOSIS — N529 Male erectile dysfunction, unspecified: Secondary | ICD-10-CM | POA: Diagnosis not present

## 2021-03-08 DIAGNOSIS — Z0000 Encounter for general adult medical examination without abnormal findings: Secondary | ICD-10-CM | POA: Diagnosis not present

## 2021-03-08 DIAGNOSIS — Z6829 Body mass index (BMI) 29.0-29.9, adult: Secondary | ICD-10-CM | POA: Diagnosis not present

## 2021-03-08 DIAGNOSIS — R911 Solitary pulmonary nodule: Secondary | ICD-10-CM | POA: Diagnosis not present

## 2021-03-08 DIAGNOSIS — R7303 Prediabetes: Secondary | ICD-10-CM | POA: Diagnosis not present

## 2021-03-08 DIAGNOSIS — E7849 Other hyperlipidemia: Secondary | ICD-10-CM | POA: Diagnosis not present

## 2021-03-08 DIAGNOSIS — I10 Essential (primary) hypertension: Secondary | ICD-10-CM | POA: Diagnosis not present

## 2021-03-08 DIAGNOSIS — K219 Gastro-esophageal reflux disease without esophagitis: Secondary | ICD-10-CM | POA: Diagnosis not present

## 2021-06-01 DIAGNOSIS — D691 Qualitative platelet defects: Secondary | ICD-10-CM | POA: Diagnosis not present

## 2021-06-21 DIAGNOSIS — I7143 Infrarenal abdominal aortic aneurysm, without rupture: Secondary | ICD-10-CM | POA: Diagnosis not present

## 2021-06-21 DIAGNOSIS — I714 Abdominal aortic aneurysm, without rupture, unspecified: Secondary | ICD-10-CM | POA: Diagnosis not present

## 2021-11-22 DIAGNOSIS — E7849 Other hyperlipidemia: Secondary | ICD-10-CM | POA: Diagnosis not present

## 2021-11-22 DIAGNOSIS — R7303 Prediabetes: Secondary | ICD-10-CM | POA: Diagnosis not present

## 2021-11-22 DIAGNOSIS — K219 Gastro-esophageal reflux disease without esophagitis: Secondary | ICD-10-CM | POA: Diagnosis not present

## 2021-11-22 DIAGNOSIS — R911 Solitary pulmonary nodule: Secondary | ICD-10-CM | POA: Diagnosis not present

## 2021-11-22 DIAGNOSIS — I10 Essential (primary) hypertension: Secondary | ICD-10-CM | POA: Diagnosis not present

## 2021-12-04 DIAGNOSIS — Z1211 Encounter for screening for malignant neoplasm of colon: Secondary | ICD-10-CM | POA: Diagnosis not present

## 2021-12-04 DIAGNOSIS — Z1212 Encounter for screening for malignant neoplasm of rectum: Secondary | ICD-10-CM | POA: Diagnosis not present

## 2021-12-12 LAB — COLOGUARD: COLOGUARD: NEGATIVE

## 2022-01-24 DIAGNOSIS — D691 Qualitative platelet defects: Secondary | ICD-10-CM | POA: Diagnosis not present

## 2022-02-21 DIAGNOSIS — D696 Thrombocytopenia, unspecified: Secondary | ICD-10-CM | POA: Diagnosis not present

## 2022-03-26 ENCOUNTER — Encounter: Payer: Self-pay | Admitting: *Deleted

## 2022-06-14 ENCOUNTER — Encounter: Payer: Self-pay | Admitting: *Deleted

## 2022-07-13 DIAGNOSIS — E039 Hypothyroidism, unspecified: Secondary | ICD-10-CM | POA: Diagnosis not present

## 2022-07-13 DIAGNOSIS — D691 Qualitative platelet defects: Secondary | ICD-10-CM | POA: Diagnosis not present

## 2022-07-13 DIAGNOSIS — D696 Thrombocytopenia, unspecified: Secondary | ICD-10-CM | POA: Diagnosis not present

## 2022-07-13 DIAGNOSIS — E559 Vitamin D deficiency, unspecified: Secondary | ICD-10-CM | POA: Diagnosis not present

## 2022-07-13 DIAGNOSIS — Z0001 Encounter for general adult medical examination with abnormal findings: Secondary | ICD-10-CM | POA: Diagnosis not present

## 2022-07-13 DIAGNOSIS — R7303 Prediabetes: Secondary | ICD-10-CM | POA: Diagnosis not present

## 2022-07-13 DIAGNOSIS — K219 Gastro-esophageal reflux disease without esophagitis: Secondary | ICD-10-CM | POA: Diagnosis not present

## 2022-07-13 DIAGNOSIS — I10 Essential (primary) hypertension: Secondary | ICD-10-CM | POA: Diagnosis not present

## 2022-07-13 DIAGNOSIS — E7849 Other hyperlipidemia: Secondary | ICD-10-CM | POA: Diagnosis not present

## 2022-07-18 DIAGNOSIS — E7849 Other hyperlipidemia: Secondary | ICD-10-CM | POA: Diagnosis not present

## 2022-07-18 DIAGNOSIS — I10 Essential (primary) hypertension: Secondary | ICD-10-CM | POA: Diagnosis not present

## 2022-07-18 DIAGNOSIS — R911 Solitary pulmonary nodule: Secondary | ICD-10-CM | POA: Diagnosis not present

## 2022-07-18 DIAGNOSIS — R059 Cough, unspecified: Secondary | ICD-10-CM | POA: Diagnosis not present

## 2022-07-18 DIAGNOSIS — Z0001 Encounter for general adult medical examination with abnormal findings: Secondary | ICD-10-CM | POA: Diagnosis not present

## 2022-07-18 DIAGNOSIS — K219 Gastro-esophageal reflux disease without esophagitis: Secondary | ICD-10-CM | POA: Diagnosis not present

## 2022-07-18 DIAGNOSIS — R7303 Prediabetes: Secondary | ICD-10-CM | POA: Diagnosis not present

## 2022-07-20 DIAGNOSIS — R911 Solitary pulmonary nodule: Secondary | ICD-10-CM | POA: Diagnosis not present

## 2022-07-20 DIAGNOSIS — I700 Atherosclerosis of aorta: Secondary | ICD-10-CM | POA: Diagnosis not present

## 2022-07-20 DIAGNOSIS — I2510 Atherosclerotic heart disease of native coronary artery without angina pectoris: Secondary | ICD-10-CM | POA: Diagnosis not present

## 2022-07-24 ENCOUNTER — Encounter (INDEPENDENT_AMBULATORY_CARE_PROVIDER_SITE_OTHER): Payer: Self-pay | Admitting: *Deleted

## 2022-08-29 DIAGNOSIS — E039 Hypothyroidism, unspecified: Secondary | ICD-10-CM | POA: Diagnosis not present

## 2022-09-13 DIAGNOSIS — I7121 Aneurysm of the ascending aorta, without rupture: Secondary | ICD-10-CM | POA: Diagnosis not present

## 2022-09-25 ENCOUNTER — Ambulatory Visit (INDEPENDENT_AMBULATORY_CARE_PROVIDER_SITE_OTHER): Payer: Medicare Other | Admitting: Gastroenterology

## 2022-12-14 DIAGNOSIS — E039 Hypothyroidism, unspecified: Secondary | ICD-10-CM | POA: Diagnosis not present

## 2022-12-17 DIAGNOSIS — I10 Essential (primary) hypertension: Secondary | ICD-10-CM | POA: Diagnosis not present

## 2022-12-17 DIAGNOSIS — K219 Gastro-esophageal reflux disease without esophagitis: Secondary | ICD-10-CM | POA: Diagnosis not present

## 2022-12-17 DIAGNOSIS — I712 Thoracic aortic aneurysm, without rupture, unspecified: Secondary | ICD-10-CM | POA: Diagnosis not present

## 2022-12-17 DIAGNOSIS — R059 Cough, unspecified: Secondary | ICD-10-CM | POA: Diagnosis not present

## 2022-12-17 DIAGNOSIS — E7849 Other hyperlipidemia: Secondary | ICD-10-CM | POA: Diagnosis not present

## 2022-12-17 DIAGNOSIS — R7303 Prediabetes: Secondary | ICD-10-CM | POA: Diagnosis not present

## 2022-12-17 DIAGNOSIS — R911 Solitary pulmonary nodule: Secondary | ICD-10-CM | POA: Diagnosis not present

## 2022-12-17 DIAGNOSIS — Z23 Encounter for immunization: Secondary | ICD-10-CM | POA: Diagnosis not present

## 2023-03-21 DIAGNOSIS — R7303 Prediabetes: Secondary | ICD-10-CM | POA: Diagnosis not present

## 2023-07-16 DIAGNOSIS — E039 Hypothyroidism, unspecified: Secondary | ICD-10-CM | POA: Diagnosis not present

## 2023-07-16 DIAGNOSIS — E7849 Other hyperlipidemia: Secondary | ICD-10-CM | POA: Diagnosis not present

## 2023-07-16 DIAGNOSIS — D696 Thrombocytopenia, unspecified: Secondary | ICD-10-CM | POA: Diagnosis not present

## 2023-07-16 DIAGNOSIS — I10 Essential (primary) hypertension: Secondary | ICD-10-CM | POA: Diagnosis not present

## 2023-07-16 DIAGNOSIS — E559 Vitamin D deficiency, unspecified: Secondary | ICD-10-CM | POA: Diagnosis not present

## 2023-07-16 DIAGNOSIS — R7303 Prediabetes: Secondary | ICD-10-CM | POA: Diagnosis not present

## 2023-07-23 DIAGNOSIS — R7303 Prediabetes: Secondary | ICD-10-CM | POA: Diagnosis not present

## 2023-07-23 DIAGNOSIS — E785 Hyperlipidemia, unspecified: Secondary | ICD-10-CM | POA: Diagnosis not present

## 2023-07-23 DIAGNOSIS — Z0001 Encounter for general adult medical examination with abnormal findings: Secondary | ICD-10-CM | POA: Diagnosis not present

## 2023-07-23 DIAGNOSIS — D696 Thrombocytopenia, unspecified: Secondary | ICD-10-CM | POA: Diagnosis not present

## 2023-07-23 DIAGNOSIS — R911 Solitary pulmonary nodule: Secondary | ICD-10-CM | POA: Diagnosis not present

## 2023-09-12 DIAGNOSIS — I712 Thoracic aortic aneurysm, without rupture, unspecified: Secondary | ICD-10-CM | POA: Diagnosis not present

## 2023-09-12 DIAGNOSIS — I2510 Atherosclerotic heart disease of native coronary artery without angina pectoris: Secondary | ICD-10-CM | POA: Diagnosis not present

## 2023-09-12 DIAGNOSIS — I7123 Aneurysm of the descending thoracic aorta, without rupture: Secondary | ICD-10-CM | POA: Diagnosis not present

## 2023-09-12 DIAGNOSIS — I7121 Aneurysm of the ascending aorta, without rupture: Secondary | ICD-10-CM | POA: Diagnosis not present

## 2023-12-17 DIAGNOSIS — I10 Essential (primary) hypertension: Secondary | ICD-10-CM | POA: Diagnosis not present

## 2023-12-17 DIAGNOSIS — E039 Hypothyroidism, unspecified: Secondary | ICD-10-CM | POA: Diagnosis not present

## 2023-12-17 DIAGNOSIS — D559 Anemia due to enzyme disorder, unspecified: Secondary | ICD-10-CM | POA: Diagnosis not present

## 2023-12-17 DIAGNOSIS — E785 Hyperlipidemia, unspecified: Secondary | ICD-10-CM | POA: Diagnosis not present

## 2023-12-24 DIAGNOSIS — I10 Essential (primary) hypertension: Secondary | ICD-10-CM | POA: Diagnosis not present

## 2023-12-24 DIAGNOSIS — D696 Thrombocytopenia, unspecified: Secondary | ICD-10-CM | POA: Diagnosis not present

## 2023-12-24 DIAGNOSIS — Z1389 Encounter for screening for other disorder: Secondary | ICD-10-CM | POA: Diagnosis not present

## 2023-12-24 DIAGNOSIS — R7303 Prediabetes: Secondary | ICD-10-CM | POA: Diagnosis not present

## 2023-12-24 DIAGNOSIS — R911 Solitary pulmonary nodule: Secondary | ICD-10-CM | POA: Diagnosis not present

## 2023-12-24 DIAGNOSIS — Z0001 Encounter for general adult medical examination with abnormal findings: Secondary | ICD-10-CM | POA: Diagnosis not present

## 2024-01-28 DIAGNOSIS — R7303 Prediabetes: Secondary | ICD-10-CM | POA: Diagnosis not present

## 2024-01-28 DIAGNOSIS — E782 Mixed hyperlipidemia: Secondary | ICD-10-CM | POA: Diagnosis not present

## 2024-01-28 DIAGNOSIS — I10 Essential (primary) hypertension: Secondary | ICD-10-CM | POA: Diagnosis not present

## 2024-01-28 DIAGNOSIS — E785 Hyperlipidemia, unspecified: Secondary | ICD-10-CM | POA: Diagnosis not present

## 2024-01-28 DIAGNOSIS — D559 Anemia due to enzyme disorder, unspecified: Secondary | ICD-10-CM | POA: Diagnosis not present

## 2024-01-28 DIAGNOSIS — E039 Hypothyroidism, unspecified: Secondary | ICD-10-CM | POA: Diagnosis not present

## 2024-02-04 DIAGNOSIS — I10 Essential (primary) hypertension: Secondary | ICD-10-CM | POA: Diagnosis not present

## 2024-02-04 DIAGNOSIS — D696 Thrombocytopenia, unspecified: Secondary | ICD-10-CM | POA: Diagnosis not present

## 2024-02-04 DIAGNOSIS — R7303 Prediabetes: Secondary | ICD-10-CM | POA: Diagnosis not present

## 2024-02-04 DIAGNOSIS — R911 Solitary pulmonary nodule: Secondary | ICD-10-CM | POA: Diagnosis not present

## 2024-03-11 NOTE — Progress Notes (Signed)
 Cardiothoracic Surgery Clinic Note Wed 03/11/2024  Patient:  Jason Gardner [address] [place], [place]  [address]  MRN: [id number] DOB: 07-Feb-1951 Age: 73 y.o.  Encounter Date:  03/11/2024  Provider: Lawson Dene LULLA Alana  Reason for Visit: Evaluation and/or follow up on ascending aortic aneurysm    Subjective:  HPI:   Mr. Jason Gardner is a 73 year old male presents for ascending aortic aneurysm surveillance last CT in 09/12/23 was measuring 4.1cm. PMHx abdominal aneurysm repair in 2019.     He denies any palpitations, chest pain, LE edema, SOB or DOE.  He denies any light-headedness, syncopal episodes, fever, chills or night sweats. HR and BP has been stable, he checks it everyday and is compliant with taking his antihypertensive medications.   Medical History[1]  Surgical History[2]  Family History[3]  Social History   Socioeconomic History   Marital status: Divorced    Spouse name: Not on file   Number of children: Not on file   Years of education: Not on file   Highest education level: Not on file  Occupational History   Not on file  Tobacco Use   Smoking status: Never   Smokeless tobacco: Never  Substance and Sexual Activity   Alcohol  use: Not on file   Drug use: No   Sexual activity: Not on file  Other Topics Concern   Not on file  Social History Narrative   Not on file   Social Drivers of Health   Food Insecurity: Low Risk  (09/12/2023)   Food vital sign    Within the past 12 months, you worried that your food would run out before you got money to buy more: Never true    Within the past 12 months, the food you bought just didn't last and you didn't have money to get more: Never true  Transportation Needs: No Transportation Needs (09/12/2023)   Transportation    In the past 12 months, has lack of reliable transportation kept you from medical appointments, meetings, work or from getting things needed for daily living?  : No  Safety: Low Risk  (09/12/2023)   Safety    How often does anyone, including family and friends, physically hurt you?: Never    How often does anyone, including family and friends, insult or talk down to you?: Never    How often does anyone, including family and friends, threaten you with harm?: Never    How often does anyone, including family and friends, scream or curse at you?: Never  Living Situation: Low Risk  (09/12/2023)   Living Situation    What is your living situation today?: I have a steady place to live    Think about the place you live. Do you have problems with any of the following? Choose all that apply:: None/None on this list    Allergies[4]  Medications Prior to Visit[5]    Objective:  Physical Examination:  BP: ()/()  Arterial Line BP: ()/()  Arterial Line BP 2: ()/()   General appearance - alert, well appearing, and in no distress Chest - clear to auscultation, no wheezes, rales or rhonchi, symmetric air entry Heart - normal rate, regular rhythm, normal S1, S2, no murmurs, rubs, clicks or gallops   Diagnostics:     Imaging:  Date Results  CXR:      Date Results  Chest CT: 03/12/2024 Images independently reviewed and interpreted. Ascending aorta at level of pulmonary artery bifurcation measured at 4,1cm, compared to CT  chest from 09/12/23 it is stable in appearance.     Date Results  Echocardiogram: 03/12/2024 SUMMARY  The left ventricular size is normal with normal left ventricular wall  thickness.  Left ventricular systolic function is normal.  LV ejection fraction = 55-60%.  The right ventricle is normal size.  The right ventricular systolic function is normal.  The aortic valve is trileaflet.  There is no aortic regurgitation.  Mildly dilated ascending aorta  4.1cm .  There is no comparison study available.     Date Results  ECG:      Assessment: Patient Active Problem List   Diagnosis Date Noted    Methacholine challenge  positive 07/25/2017   AAA (abdominal aortic aneurysm) without rupture (HCC) 04/16/2017   Essential hypertension 04/16/2017   Mixed hyperlipidemia 04/16/2017    Resolved Problems  No resolved problems to display.   Ascending aortic aneurysm measuring 4.1cm. Trileaflet aortic valve with no AI or AS on TTE Plan: -Continue to monitor and record your blood pressure. -No surgical management indicated. Will continue to monitor this over time.  -Ideal blood pressure is <130/80 -Continue to take your medications -Repeat Chest CT in one year -If you experience any sudden chest/back pain, or any other sudden changes, call 911, or present to your nearest Emergency Department immediately   Electronically signed by: Lawson Dene LULLA Alana, NP 03/11/2024 1:54 PM        [1] Past Medical History: Diagnosis Date   AAA (abdominal aortic aneurysm) without rupture    Hypertension    Solitary pulmonary nodule   [2] Past Surgical History: Procedure Laterality Date   ABDOMINAL AORTIC ANEURYSM REPAIR N/A 08/06/2017   Procedure: OPEN ABDOMINAL AORTA ANEURYSM REPAIR;  Surgeon: Donnice Mannie Bohr, MD;  [facility] MAIN OR;  Service: Vascular;  Laterality: N/A;   CARPAL TUNNEL RELEASE Right 2013   Procedure: CARPAL TUNNEL RELEASE   INGUINAL HERNIA REPAIR  1960s   Procedure: INGUINAL HERNIA REPAIR; times two   WRIST SURGERY  1970s   Procedure: WRIST SURGERY  [3] Family History Problem Relation Name Age of Onset   Anesthesia problems Neg Hx     Heart attack Neg Hx     Stroke Neg Hx    [4] No Known Allergies [5] Outpatient Medications Prior to Visit  Medication Sig Dispense Refill   amLODIPine (NORVASC) 10 mg tablet Take 10 mg by mouth Once Daily.     olmesartan (BENICAR) 20 mg tablet Take 20 mg by mouth daily.     olmesartan (BENICAR) 40 mg tablet Take 40 mg by mouth Once Daily.     omeprazole (PriLOSEC) 20 mg DR capsule TAKE 1 CAPSULE BY MOUTH IN THE MORNING FOR ACID REFLUX      rosuvastatin  (CRESTOR ) 20 mg tablet Take 20 mg by mouth Once Daily.     No facility-administered medications prior to visit.

## 2024-03-12 DIAGNOSIS — I7121 Aneurysm of the ascending aorta, without rupture: Secondary | ICD-10-CM | POA: Diagnosis not present

## 2024-03-12 DIAGNOSIS — I7123 Aneurysm of the descending thoracic aorta, without rupture: Secondary | ICD-10-CM | POA: Diagnosis not present

## 2024-03-12 DIAGNOSIS — R935 Abnormal findings on diagnostic imaging of other abdominal regions, including retroperitoneum: Secondary | ICD-10-CM | POA: Diagnosis not present

## 2024-03-12 DIAGNOSIS — Z8679 Personal history of other diseases of the circulatory system: Secondary | ICD-10-CM | POA: Diagnosis not present

## 2024-03-12 DIAGNOSIS — I7142 Juxtarenal abdominal aortic aneurysm, without rupture: Secondary | ICD-10-CM | POA: Diagnosis not present

## 2024-03-12 DIAGNOSIS — E782 Mixed hyperlipidemia: Secondary | ICD-10-CM | POA: Diagnosis not present

## 2024-03-12 DIAGNOSIS — J9811 Atelectasis: Secondary | ICD-10-CM | POA: Diagnosis not present

## 2024-03-12 DIAGNOSIS — I10 Essential (primary) hypertension: Secondary | ICD-10-CM | POA: Diagnosis not present

## 2024-07-15 ENCOUNTER — Other Ambulatory Visit (HOSPITAL_COMMUNITY): Payer: Self-pay | Admitting: Internal Medicine

## 2024-07-15 DIAGNOSIS — R591 Generalized enlarged lymph nodes: Secondary | ICD-10-CM

## 2024-07-16 ENCOUNTER — Encounter (INDEPENDENT_AMBULATORY_CARE_PROVIDER_SITE_OTHER): Payer: Self-pay | Admitting: *Deleted

## 2024-07-17 ENCOUNTER — Ambulatory Visit (HOSPITAL_COMMUNITY)
Admission: RE | Admit: 2024-07-17 | Discharge: 2024-07-17 | Disposition: A | Discharge status: Home / Self Care | Source: Ambulatory Visit | Attending: Internal Medicine | Admitting: Internal Medicine

## 2024-07-17 DIAGNOSIS — R591 Generalized enlarged lymph nodes: Secondary | ICD-10-CM | POA: Diagnosis present

## 2024-07-22 NOTE — Progress Notes (Signed)
 Rapid Diagnostic Service Follow-up  07/22/24 2:15 PM  RDS Provider, Jason Hope, NP, instruction:  Confirm patient understanding of recommended follow-up and risk for not following up.  Jason Gardner was contacted by phone and his identity was confirmed using two patient identifiers.  Reason for call is to confirm that patient no longer wishes to pursue diagnostic evaluation for his suspected malignancy at this time.  I confirmed with Jason Gardner that he understands Dr. Vaughn Gardner recommendation is to pursue diagnostic work up, and that Jason Gardner understands the risks discussed with him by Dr. Vaughn Gardner.  Jason Gardner does not wish to pursue diagnostic work-up. Patient states the lymphadenopathy has been present for quite some time and he does not wish to continue to RDS work up.   At this time [facility] offers continued support in the future.  Jason Gardner referring provider, Jason Gardner, have been sent the RDS Progress Note, associated notes from consultants and recommendations, and results of diagnostics performed to date.  Same are informed of patient's wishes to not pursue recommended diagnostic evaluation at this time.
